# Patient Record
Sex: Female | Born: 1950 | Hispanic: No | Marital: Married | Smoking: Never smoker
Health system: Southern US, Community
[De-identification: ages and names within clinical notes are randomized; demographics above are authoritative.]

## PROBLEM LIST (undated history)

## (undated) HISTORY — PX: NO PAST SURGERIES: SHX2092

---

## 2020-01-29 ENCOUNTER — Ambulatory Visit
Admission: RE | Admit: 2020-01-29 | Discharge: 2020-01-29 | Disposition: A | Discharge status: Home / Self Care | Payer: 59 | Source: Ambulatory Visit | Attending: Emergency Medicine | Admitting: Emergency Medicine

## 2020-01-29 ENCOUNTER — Ambulatory Visit
Admission: RE | Admit: 2020-01-29 | Discharge: 2020-01-29 | Disposition: A | Discharge status: Home / Self Care | Payer: 59 | Attending: Emergency Medicine | Admitting: Emergency Medicine

## 2020-01-29 ENCOUNTER — Ambulatory Visit
Admission: EM | Admit: 2020-01-29 | Discharge: 2020-01-29 | Disposition: A | Discharge status: Home / Self Care | Payer: 59

## 2020-01-29 ENCOUNTER — Other Ambulatory Visit: Payer: Self-pay

## 2020-01-29 DIAGNOSIS — X58XXXA Exposure to other specified factors, initial encounter: Secondary | ICD-10-CM | POA: Diagnosis not present

## 2020-01-29 DIAGNOSIS — R0781 Pleurodynia: Secondary | ICD-10-CM

## 2020-01-29 DIAGNOSIS — S2232XA Fracture of one rib, left side, initial encounter for closed fracture: Secondary | ICD-10-CM | POA: Insufficient documentation

## 2020-01-29 DIAGNOSIS — R030 Elevated blood-pressure reading, without diagnosis of hypertension: Secondary | ICD-10-CM

## 2020-01-29 NOTE — ED Provider Notes (Signed)
Renaldo Fiddler    CSN: [id number] Arrival date & time: 01/29/20  1317      History   Chief Complaint Chief Complaint  Patient presents with  . Fall    HPI Sheila Molina is a 69 y.o. female.   Patient presents with left-sided rib pain x 1 week after falling while cleaning the bathtub.  She denies head injury or LOC.  She reports the pain is worse with deep inspiration, palpation, some positional movement.  She has taken Tylenol and ibuprofen with good relief of her pain.  She denies chest pain, palpitations, shortness of breath, abdominal pain, numbness, tingling, weakness, or other symptoms.  The history is provided by the patient and a relative. A language interpreter was used.    History reviewed. No pertinent past medical history.  There are no problems to display for this patient.   Past Surgical History:  Procedure Laterality Date  . PARTIAL HYSTERECTOMY  1981    OB History   No obstetric history on file.      Home Medications    Prior to Admission medications   Medication Sig Start Date End Date Taking? Authorizing Provider  calcium carbonate (OS-CAL - DOSED IN MG OF ELEMENTAL CALCIUM) 1250 (500 Ca) MG tablet Take 1 tablet by mouth.   Yes [provider]    Family History History reviewed. No pertinent family history.  Social History Social History   Tobacco Use  . Smoking status: Never Smoker  . Smokeless tobacco: Never Used  Substance Use Topics  . Alcohol use: Never  . Drug use: Never     Allergies   Patient has no known allergies.   Review of Systems Review of Systems  Constitutional: Negative for chills and fever.  HENT: Negative for ear pain and sore throat.   Eyes: Negative for pain and visual disturbance.  Respiratory: Negative for cough and shortness of breath.   Cardiovascular: Negative for chest pain and palpitations.       Rib pain on left side.  Gastrointestinal: Negative for abdominal pain and vomiting.    Genitourinary: Negative for dysuria and hematuria.  Musculoskeletal: Negative for arthralgias and back pain.  Skin: Negative for color change and rash.  Neurological: Negative for seizures and syncope.  All other systems reviewed and are negative.    Physical Exam Triage Vital Signs ED Triage Vitals  Enc Vitals Group     BP      Pulse      Resp      Temp      Temp src      SpO2      Weight      Height      Head Circumference      Peak Flow      Pain Score      Pain Loc      Pain Edu?      Excl. in GC?    No data found.  Updated Vital Signs BP (!) 185/88 (BP Location: Left Arm)   Pulse 89   Temp 99 F (37.2 C) (Oral)   Resp 18   SpO2 100%   Visual Acuity Right Eye Distance:   Left Eye Distance:   Bilateral Distance:    Right Eye Near:   Left Eye Near:    Bilateral Near:     Physical Exam Vitals and nursing note reviewed.  Constitutional:      General: She is not in acute distress.    Appearance:  She is well-developed.  HENT:     Head: Normocephalic and atraumatic.     Mouth/Throat:     Mouth: Mucous membranes are moist.     Pharynx: Oropharynx is clear.  Eyes:     Conjunctiva/sclera: Conjunctivae normal.  Cardiovascular:     Rate and Rhythm: Normal rate and regular rhythm.     Heart sounds: No murmur.  Pulmonary:     Effort: Pulmonary effort is normal. No respiratory distress.     Breath sounds: Normal breath sounds. No wheezing, rhonchi or rales.    Abdominal:     General: Bowel sounds are normal.     Palpations: Abdomen is soft.     Tenderness: There is no abdominal tenderness. There is no guarding or rebound.  Musculoskeletal:        General: Tenderness present. No swelling or deformity.     Cervical back: Neck supple.  Skin:    General: Skin is warm and dry.     Findings: Bruising present. No erythema, lesion or rash.  Neurological:     General: No focal deficit present.     Mental Status: She is alert.     Sensory: No sensory  deficit.     Motor: No weakness.     Gait: Gait normal.      UC Treatments / Results  Labs (all labs ordered are listed, but only abnormal results are displayed) Labs Reviewed - No data to display  EKG   Radiology DG Ribs Unilateral W/Chest Left  Result Date: 01/29/2020 CLINICAL DATA:  Left chest pain and bruising after fall again stubbed 1 week ago. EXAM: LEFT RIBS AND CHEST - 3+ VIEW COMPARISON:  None. FINDINGS: Nondisplaced left anterior eighth rib fracture. Remaining ribs are intact. There is no evidence of pneumothorax or pleural effusion. Both lungs are clear. Heart size and mediastinal contours are within normal limits. IMPRESSION: Nondisplaced left anterior eighth rib fracture. No pulmonary complication such as pneumothorax. Electronically Signed   By: Narda Rutherford M.D.   On: 01/29/2020 15:07    Procedures Procedures (including critical care time)  Medications Ordered in UC Medications - No data to display  Initial Impression / Assessment and Plan / UC Course  I have reviewed the triage vital signs and the nursing notes.  Pertinent labs & imaging results that were available during my care of the patient were reviewed by me and considered in my medical decision making (see chart for details).   Rib fracture, left 8th rib. Elevated blood pressure.  Xray shows "Nondisplaced left anterior eighth rib fracture. No pulmonary complication such as pneumothorax."    Instructed patient to take Tylenol as needed for discomfort and follow-up with her PCP or Ortho if her symptoms are not improving.  Discussed that she should go to the ED if she has increased pain or starts having difficulty breathing.  Also discussed that her blood pressure is elevated today and needs to be rechecked by her PCP in 2 to 4 weeks.  Patient agrees to plan of care.      Final Clinical Impressions(s) / UC Diagnoses   Final diagnoses:  Closed fracture of one rib of left side, initial encounter  Elevated  blood pressure reading     Discharge Instructions     You have a broken rib but your lung is okay.  Go to the emergency department if you have increased pain or start having difficulty breathing.    Take Tylenol as needed for discomfort.  Follow  up with your primary care provider or an orthopedist if your symptoms are not improving.    Your blood pressure is elevated today at 185/88.  Please have this rechecked by your primary care provider in 2-4 weeks.         ED Prescriptions    None     PDMP not reviewed this encounter.   Sharion Balloon, NP 01/29/20 [phone]

## 2020-01-29 NOTE — ED Triage Notes (Signed)
Pt was cleaning bathtub and slipped, falling toward tub.  C/o lower left anterior rib pain that sometimes wraps around left side.  Pt reports bruise in this area.  Pt able to take normal breath but has increased pain with a deep inspiration or with certain positions.  Denies other injuries.  Did not hit head, denies LOC.  Some improvement in pain with Tylenol and Ibuprofen at home.    Pt reports chills yesterday.

## 2020-01-29 NOTE — Discharge Instructions (Addendum)
You have a broken rib but your lung is okay.  Go to the emergency department if you have increased pain or start having difficulty breathing.    Take Tylenol as needed for discomfort.  Follow up with your primary care provider or an orthopedist if your symptoms are not improving.    Your blood pressure is elevated today at 185/88.  Please have this rechecked by your primary care provider in 2-4 weeks.

## 2020-03-16 ENCOUNTER — Encounter: Payer: Self-pay | Admitting: Family Medicine

## 2020-03-16 ENCOUNTER — Other Ambulatory Visit: Payer: Self-pay

## 2020-03-16 ENCOUNTER — Ambulatory Visit (INDEPENDENT_AMBULATORY_CARE_PROVIDER_SITE_OTHER): Payer: 59 | Admitting: Family Medicine

## 2020-03-16 VITALS — BP 158/78 | HR 96 | Temp 97.3°F | Resp 18 | Ht 63.0 in | Wt 100.8 lb

## 2020-03-16 DIAGNOSIS — Z1159 Encounter for screening for other viral diseases: Secondary | ICD-10-CM

## 2020-03-16 DIAGNOSIS — R630 Anorexia: Secondary | ICD-10-CM

## 2020-03-16 DIAGNOSIS — Z23 Encounter for immunization: Secondary | ICD-10-CM

## 2020-03-16 DIAGNOSIS — Z13220 Encounter for screening for lipoid disorders: Secondary | ICD-10-CM

## 2020-03-16 DIAGNOSIS — R634 Abnormal weight loss: Secondary | ICD-10-CM | POA: Diagnosis not present

## 2020-03-16 DIAGNOSIS — Z131 Encounter for screening for diabetes mellitus: Secondary | ICD-10-CM

## 2020-03-16 DIAGNOSIS — R030 Elevated blood-pressure reading, without diagnosis of hypertension: Secondary | ICD-10-CM

## 2020-03-16 DIAGNOSIS — R636 Underweight: Secondary | ICD-10-CM

## 2020-03-16 LAB — COMPREHENSIVE METABOLIC PANEL
ALT: 13 U/L (ref 0–35)
AST: 21 U/L (ref 0–37)
Albumin: 4.6 g/dL (ref 3.5–5.2)
Alkaline Phosphatase: 52 U/L (ref 39–117)
BUN: 11 mg/dL (ref 6–23)
CO2: 29 mEq/L (ref 19–32)
Calcium: 9.8 mg/dL (ref 8.4–10.5)
Chloride: 103 mEq/L (ref 96–112)
Creatinine, Ser: 0.64 mg/dL (ref 0.40–1.20)
GFR: 92.14 mL/min (ref >60.00)
Glucose, Bld: 106 mg/dL — ABNORMAL HIGH (ref 70–99)
Potassium: 4.1 mEq/L (ref 3.5–5.1)
Sodium: 138 mEq/L (ref 135–145)
Total Bilirubin: 0.4 mg/dL (ref 0.2–1.2)
Total Protein: 7.6 g/dL (ref 6.0–8.3)

## 2020-03-16 LAB — CBC
HCT: 37.6 % (ref 36.0–46.0)
Hemoglobin: 12.4 g/dL (ref 12.0–15.0)
MCHC: 33 g/dL (ref 30.0–36.0)
MCV: 84.7 fl (ref 78.0–100.0)
Platelets: 306 10*3/uL (ref 150.0–400.0)
RBC: 4.44 Mil/uL (ref 3.87–5.11)
RDW: 13.5 % (ref 11.5–15.5)
WBC: 6 10*3/uL (ref 4.0–10.5)

## 2020-03-16 LAB — LIPID PANEL
Cholesterol: 192 mg/dL (ref 0–200)
HDL: 64 mg/dL (ref >39.00)
LDL Cholesterol: 101 mg/dL — ABNORMAL HIGH (ref 0–99)
NonHDL: 127.54
Total CHOL/HDL Ratio: 3
Triglycerides: 131 mg/dL (ref 0.0–149.0)
VLDL: 26.2 mg/dL (ref 0.0–40.0)

## 2020-03-16 NOTE — Patient Instructions (Signed)
Weight loss - continue to work on eating frequent meals - eat when you are hungry - return in 3 months for annual visit and weight check

## 2020-03-16 NOTE — Assessment & Plan Note (Deleted)
She notes a 4 lb weight loss in 1 year but also feels her appetite is lower. Pt with low BMI

## 2020-03-16 NOTE — Assessment & Plan Note (Signed)
Elevated in the office, but family reports 110-120/70-80s at home. Will continue to monitor and encourage home monitoring. Routine labs today

## 2020-03-16 NOTE — Progress Notes (Signed)
Subjective:     Sheila Molina is a 69 y.o. female presenting for Establish Care (came from [place] in 2017. No PCP since then.) and Fall (a month ago, fell while cleaning the bathroom. Had slight fracture in the left rib. No pain now.)   Here with son-in-law who translates  HPI  #decreased appetite  - enjoys sweets - Breakfast: cereal, breads, toast - Lunch: sandwich, tortilla with cooked veggies - Dinner: rice, cooked veggies, indian food - will feel hungry if she misses mealtime - eats small frequent meals - thinks she has lost weight - about 4 lbs in one year  #Elevated bp - is often high in the doctors office - check at home typically 110-120/70-85   Review of Systems   Social History   Tobacco Use  Smoking Status Never Smoker  Smokeless Tobacco Never Used        Objective:    BP Readings from Last 3 Encounters:  03/16/20 (!) 158/78  01/29/20 (!) 185/88   Wt Readings from Last 3 Encounters:  03/16/20 100 lb 12 oz (45.7 kg)    BP (!) 158/78   Pulse 96   Temp (!) 97.3 F (36.3 C)   Resp 18   Ht 5\' 3"  (1.6 m)   Wt 100 lb 12 oz (45.7 kg)   SpO2 (!) 85%   BMI 17.85 kg/m    Physical Exam Constitutional:      General: She is not in acute distress.    Appearance: She is well-developed. She is not diaphoretic.     Comments: thin  HENT:     Right Ear: External ear normal.     Left Ear: External ear normal.  Eyes:     Conjunctiva/sclera: Conjunctivae normal.  Cardiovascular:     Rate and Rhythm: Normal rate and regular rhythm.     Heart sounds: No murmur.  Pulmonary:     Effort: Pulmonary effort is normal. No respiratory distress.     Breath sounds: Normal breath sounds. No wheezing.  Musculoskeletal:     Cervical back: Neck supple.  Skin:    General: Skin is warm and dry.     Capillary Refill: Capillary refill takes less than 2 seconds.  Neurological:     Mental Status: She is alert. Mental status is at baseline.  Psychiatric:        Mood  and Affect: Mood normal.        Behavior: Behavior normal.           Assessment & Plan:   Problem List Items Addressed This Visit      Cardiovascular and Mediastinum   White coat syndrome without hypertension    Elevated in the office, but family reports 110-120/70-80s at home. Will continue to monitor and encourage home monitoring. Routine labs today        Other   Mildly underweight adult    She notes a 4 lb weight loss in 1 year but also feels her appetite is lower. With low BMI. She notes she eats 4-6 small meals per day. Difficulty assessing caloric intake but does not seem to be skipping meals. Given only small amount of weight loss and what appears to be normal eating habits, will get some basic lab work today and then plan to see her back in 3 months for annual and weight check. If still low at that visit may discuss supplement and getting her cancer screening up to date.       RESOLVED:  Weight loss   Relevant Orders   Comprehensive metabolic panel   CBC   TSH    Other Visit Diagnoses    Decreased appetite    -  Primary   Relevant Orders   Comprehensive metabolic panel   CBC   TSH   Screening for diabetes mellitus       Screening for hyperlipidemia       Relevant Orders   Lipid panel   Encounter for hepatitis C screening test for low risk patient       Relevant Orders   Hepatitis C antibody       Return in about 3 months (around 06/16/2020) for annual wellness.  Lesleigh Noe, MD

## 2020-03-16 NOTE — Assessment & Plan Note (Signed)
She notes a 4 lb weight loss in 1 year but also feels her appetite is lower. With low BMI. She notes she eats 4-6 small meals per day. Difficulty assessing caloric intake but does not seem to be skipping meals. Given only small amount of weight loss and what appears to be normal eating habits, will get some basic lab work today and then plan to see her back in 3 months for annual and weight check. If still low at that visit may discuss supplement and getting her cancer screening up to date.

## 2020-03-17 LAB — HEPATITIS C ANTIBODY
Hepatitis C Ab: NONREACTIVE
SIGNAL TO CUT-OFF: 0.01 (ref <1.00)

## 2020-03-17 LAB — TSH: TSH: 2.53 u[IU]/mL (ref 0.35–4.50)

## 2020-03-18 ENCOUNTER — Other Ambulatory Visit: Payer: Self-pay | Admitting: Family Medicine

## 2020-03-18 DIAGNOSIS — E785 Hyperlipidemia, unspecified: Secondary | ICD-10-CM

## 2020-03-18 MED ORDER — ATORVASTATIN CALCIUM 10 MG PO TABS: 10.0000 mg | ORAL_TABLET | Freq: Every day | ORAL | 3 refills | Status: AC

## 2020-03-18 NOTE — Progress Notes (Signed)
Noted. Son in law was notified to check with pharmacy later today about the medication

## 2020-03-18 NOTE — Progress Notes (Signed)
Sending atorvastatin to pharmacy  

## 2020-05-06 ENCOUNTER — Other Ambulatory Visit: Payer: Self-pay

## 2020-05-06 ENCOUNTER — Ambulatory Visit
Admission: RE | Admit: 2020-05-06 | Discharge: 2020-05-06 | Disposition: A | Discharge status: Home / Self Care | Payer: 59 | Source: Ambulatory Visit | Attending: Emergency Medicine | Admitting: Emergency Medicine

## 2020-05-06 ENCOUNTER — Ambulatory Visit
Admission: EM | Admit: 2020-05-06 | Discharge: 2020-05-06 | Disposition: A | Discharge status: Home / Self Care | Payer: 59 | Attending: Emergency Medicine | Admitting: Emergency Medicine

## 2020-05-06 ENCOUNTER — Ambulatory Visit
Admission: RE | Admit: 2020-05-06 | Discharge: 2020-05-06 | Disposition: A | Discharge status: Home / Self Care | Payer: 59 | Attending: Emergency Medicine | Admitting: Emergency Medicine

## 2020-05-06 DIAGNOSIS — R0781 Pleurodynia: Secondary | ICD-10-CM

## 2020-05-06 DIAGNOSIS — R079 Chest pain, unspecified: Secondary | ICD-10-CM | POA: Insufficient documentation

## 2020-05-06 NOTE — ED Triage Notes (Signed)
Pt presents with left side rib & flank pain for past few days; according to spouse patient had a rib fracture injury about a month ago.

## 2020-05-06 NOTE — ED Provider Notes (Signed)
[facility]    CSN: [id number] Arrival date & time: 05/06/20  1408      History   Chief Complaint Chief Complaint  Patient presents with  . Flank Pain  . Rib Pain    HPI Sheila Molina is a 69 y.o. female.   Accompanied by her son-in-law, patient presents with left side rib pain in the same area that she had a fractured rib in April.  No new injury or falls.  She states the pain is worse when she lies on her side, takes a deep breath, or pushes on the area.  She denies shortness of breath, fever, chills, chest pain, cough, abdominal pain, or other symptoms.  Patient was seen here on 01/29/2020; diagnosed with left 8th rib fracture.  The history is provided by the patient and a relative.    History reviewed. No pertinent past medical history.  Patient Active Problem List   Diagnosis Date Noted  . Mildly underweight adult 03/16/2020  . White coat syndrome without hypertension 03/16/2020    Past Surgical History:  Procedure Laterality Date  . NO PAST SURGERIES      OB History   No obstetric history on file.      Home Medications    Prior to Admission medications   Medication Sig Start Date End Date Taking? Authorizing Provider  atorvastatin (LIPITOR) 10 MG tablet Take 1 tablet (10 mg total) by mouth daily. 03/18/20   Lynnda Child, MD  calcium carbonate (OS-CAL - DOSED IN MG OF ELEMENTAL CALCIUM) 1250 (500 Ca) MG tablet Take 1 tablet by mouth.    [provider]    Family History Family History  Problem Relation Age of Onset  . Healthy Mother        died in 55s at home  . Healthy Father        died in 62s at home  . Healthy Sister   . Healthy Brother   . Healthy Brother   . Healthy Sister     Social History Social History   Tobacco Use  . Smoking status: Never Smoker  . Smokeless tobacco: Never Used  Vaping Use  . Vaping Use: Never used  Substance Use Topics  . Alcohol use: Never  . Drug use: Never     Allergies   Patient has  no known allergies.   Review of Systems Review of Systems  Constitutional: Negative for chills and fever.  HENT: Negative for ear pain and sore throat.   Eyes: Negative for pain and visual disturbance.  Respiratory: Negative for cough and shortness of breath.   Cardiovascular: Negative for chest pain and palpitations.  Gastrointestinal: Negative for abdominal pain and vomiting.  Genitourinary: Negative for dysuria and hematuria.  Musculoskeletal: Negative for arthralgias.       Left rib pain.  Skin: Negative for color change and rash.  Neurological: Negative for seizures and syncope.  All other systems reviewed and are negative.    Physical Exam Triage Vital Signs ED Triage Vitals  Enc Vitals Group     BP 05/06/20 1414 (!) 164/81     Pulse Rate 05/06/20 1414 80     Resp 05/06/20 1414 17     Temp 05/06/20 1414 97.7 F (36.5 C)     Temp Source 05/06/20 1414 Oral     SpO2 05/06/20 1414 99 %     Weight --      Height --      Head Circumference --  Peak Flow --      Pain Score 05/06/20 1412 5     Pain Loc --      Pain Edu? --      Excl. in GC? --    No data found.  Updated Vital Signs BP (!) 164/81 (BP Location: Right Arm)   Pulse 80   Temp 97.7 F (36.5 C) (Oral)   Resp 17   SpO2 99%   Visual Acuity Right Eye Distance:   Left Eye Distance:   Bilateral Distance:    Right Eye Near:   Left Eye Near:    Bilateral Near:     Physical Exam Vitals and nursing note reviewed.  Constitutional:      General: She is not in acute distress.    Appearance: She is well-developed. She is not ill-appearing.  HENT:     Head: Normocephalic and atraumatic.     Mouth/Throat:     Mouth: Mucous membranes are moist.     Pharynx: Oropharynx is clear.  Eyes:     Conjunctiva/sclera: Conjunctivae normal.  Cardiovascular:     Rate and Rhythm: Normal rate and regular rhythm.     Heart sounds: No murmur heard.   Pulmonary:     Effort: Pulmonary effort is normal. No  respiratory distress.     Breath sounds: Normal breath sounds. No wheezing or rhonchi.    Abdominal:     Palpations: Abdomen is soft.     Tenderness: There is no abdominal tenderness.  Musculoskeletal:        General: No swelling or deformity. Normal range of motion.     Cervical back: Neck supple.  Skin:    General: Skin is warm and dry.     Findings: No bruising, erythema, lesion or rash.  Neurological:     General: No focal deficit present.     Mental Status: She is alert and oriented to person, place, and time.     Gait: Gait normal.  Psychiatric:        Mood and Affect: Mood normal.        Behavior: Behavior normal.      UC Treatments / Results  Labs (all labs ordered are listed, but only abnormal results are displayed) Labs Reviewed - No data to display  EKG   Radiology DG Ribs Unilateral W/Chest Left  Result Date: 05/06/2020 CLINICAL DATA:  69 year old female with left-sided chest pain on deep inspiration. EXAM: LEFT RIBS AND CHEST - 3+ VIEW COMPARISON:  Chest radiograph dated 01/29/2020. FINDINGS: The lungs are clear. There is no pleural effusion or pneumothorax. The cardiac silhouette is within limits. No acute osseous pathology. No displaced rib fractures. IMPRESSION: Negative. Electronically Signed   By: Elgie Collard M.D.   On: 05/06/2020 15:32    Procedures Procedures (including critical care time)  Medications Ordered in UC Medications - No data to display  Initial Impression / Assessment and Plan / UC Course  I have reviewed the triage vital signs and the nursing notes.  Pertinent labs & imaging results that were available during my care of the patient were reviewed by me and considered in my medical decision making (see chart for details).   Left rib pain.  Xray negative.  Tylenol as needed for discomfort.  Instructed patient and son-in-law to follow-up with her primary care provider as needed if her symptoms are not improving.  They agree to plan of  care.     Final Clinical Impressions(s) / UC Diagnoses  Final diagnoses:  Rib pain on left side     Discharge Instructions     Go to [facility] for the xray.  I will call you with the results.    Follow up with your primary care provider if your symptoms are not improving.       ED Prescriptions    None     PDMP not reviewed this encounter.   Mickie Bail, NP 05/06/20 [phone]

## 2020-05-06 NOTE — ED Notes (Signed)
Pt presents with left side rib & flank pain for past few days; according to spouse patient had a rib fracture injury about a month ago.  

## 2020-05-06 NOTE — Discharge Instructions (Signed)
Go to [facility] for the xray.  I will call you with the results.    Follow up with your primary care provider if your symptoms are not improving.

## 2020-06-10 ENCOUNTER — Telehealth: Payer: Self-pay

## 2020-06-10 ENCOUNTER — Other Ambulatory Visit: Payer: Self-pay

## 2020-06-10 ENCOUNTER — Ambulatory Visit: Payer: 59

## 2020-06-10 NOTE — Telephone Encounter (Signed)
Called patient to complete her Medicare visit. A gentleman answered the phone. Major language barrier and everyone decided it would be better to complete face to face in the office. Appointment cancelled per family request.

## 2020-06-10 NOTE — Telephone Encounter (Signed)
Noted  

## 2020-06-17 ENCOUNTER — Encounter: Payer: 59 | Admitting: Family Medicine

## 2021-01-17 ENCOUNTER — Encounter: Payer: 59 | Admitting: Family Medicine

## 2022-03-26 IMAGING — CR DG RIBS W/ CHEST 3+V*L*
1 series · 5 of 5 positions shown · non-contrast
Comparison: None.

CLINICAL DATA: Left chest pain and bruising after fall again
stubbed 1 week ago.

EXAM:
LEFT RIBS AND CHEST - 3+ VIEW

[Series 1: dg ribs unilateral w/chest left · 0.14mm/px · 5 of 5 slices shown]
[im 1/5]
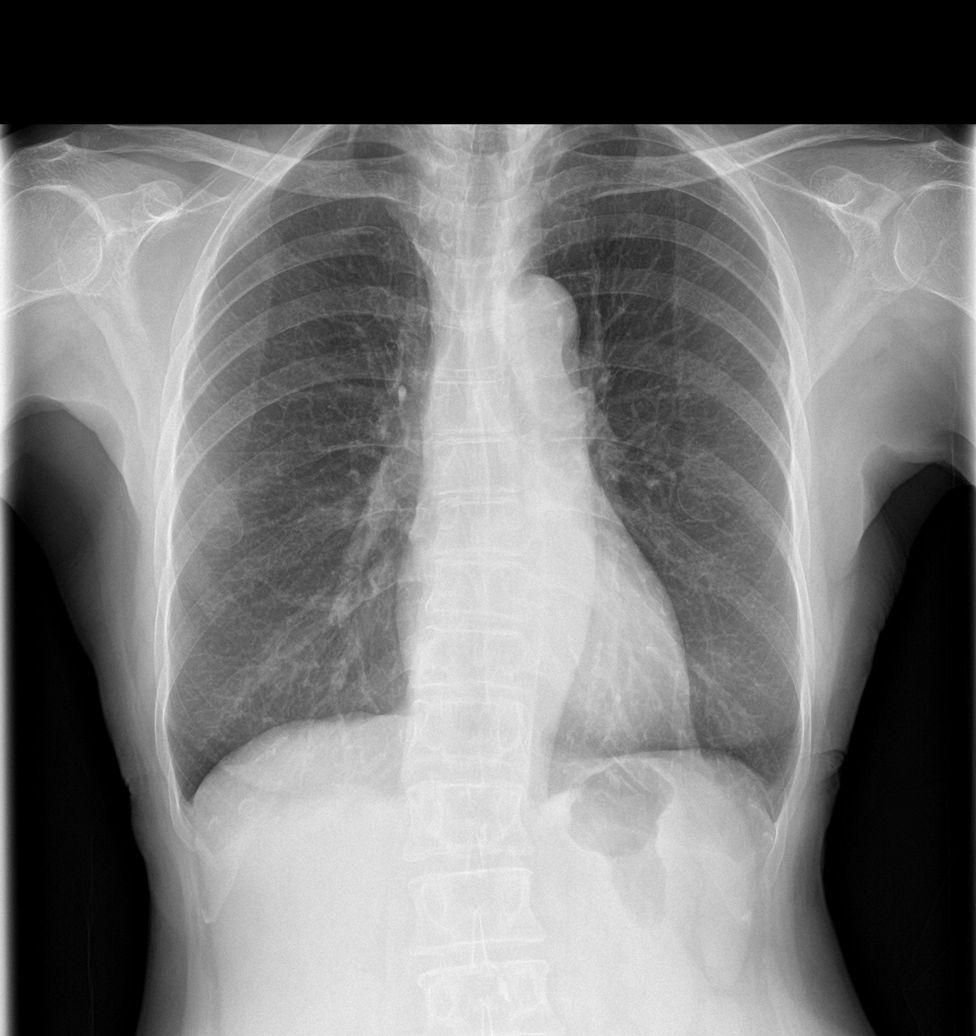
[im 2/5]
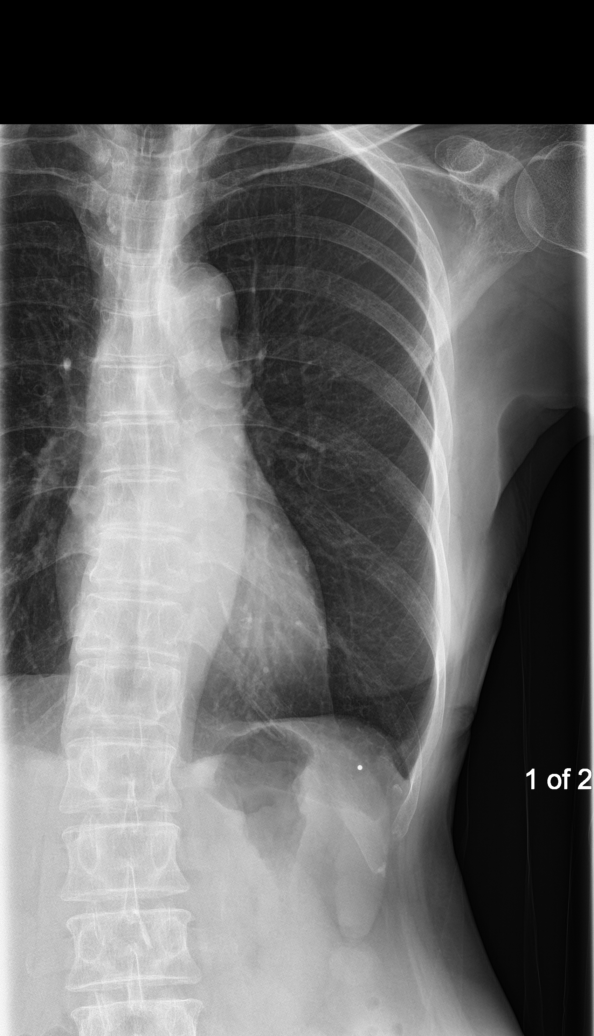
[im 3/5]
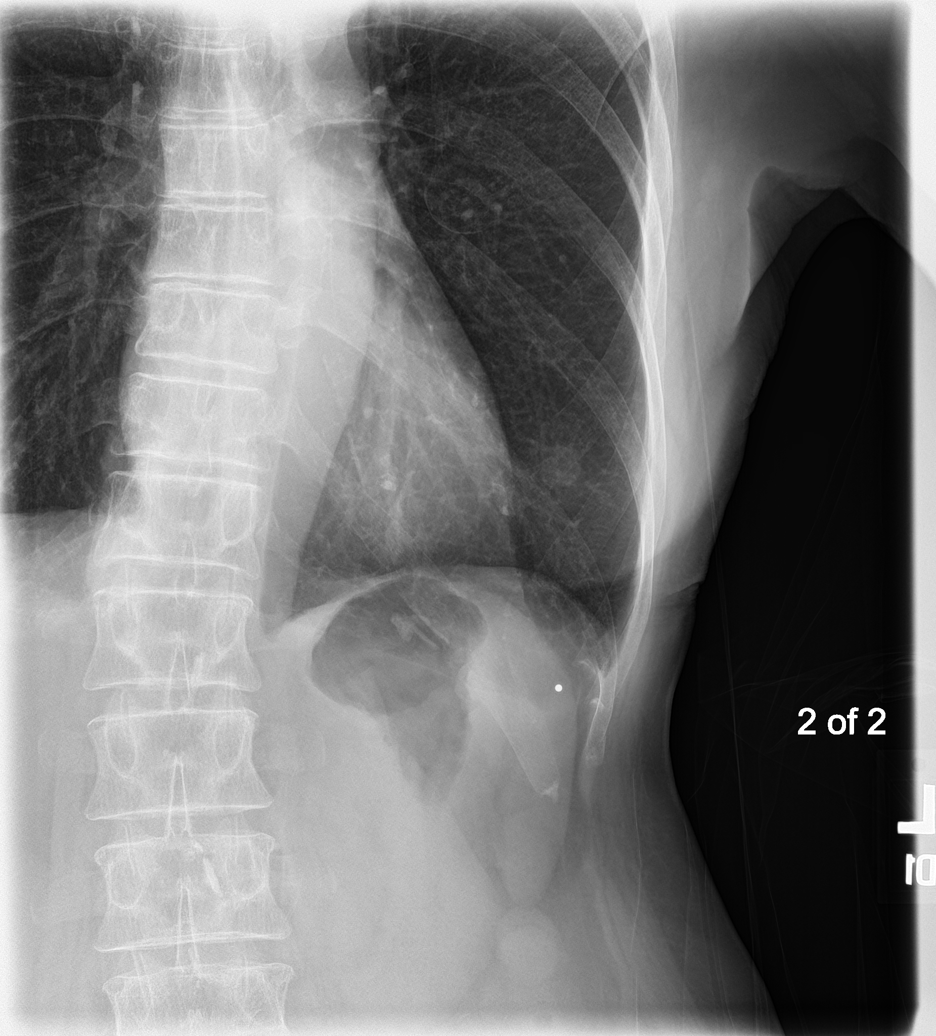
[im 4/5]
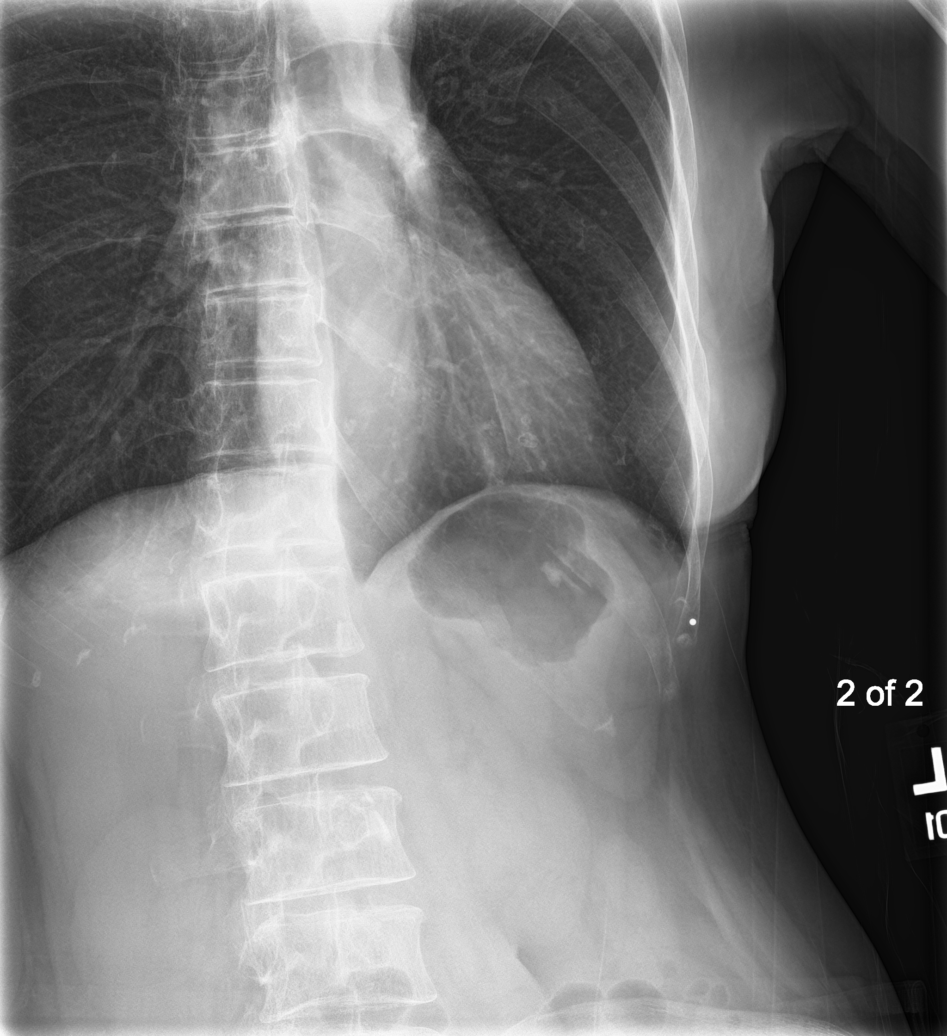
[im 5/5]
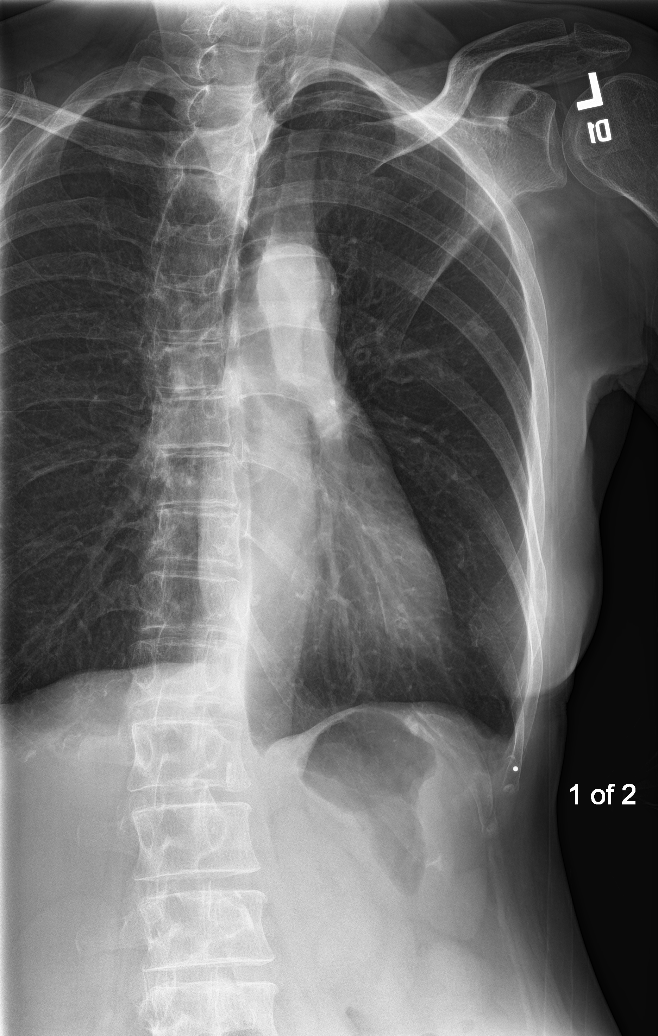

[5 of 5 positions shown; findings below may reference images not displayed]

FINDINGS: Nondisplaced left anterior eighth rib fracture. Remaining ribs are
intact. There is no evidence of pneumothorax or pleural effusion.
Both lungs are clear. Heart size and mediastinal contours are within
normal limits.
IMPRESSION: Nondisplaced left anterior eighth rib fracture. No pulmonary
complication such as pneumothorax.
